# Patient Record
Sex: Male | Born: 1975 | Race: Black or African American | Hispanic: No | Marital: Married
Health system: Southern US, Community
[De-identification: ages and names within clinical notes are randomized; demographics above are authoritative.]

---

## 2021-01-14 ENCOUNTER — Emergency Department (HOSPITAL_COMMUNITY)
Admission: EM | Admit: 2021-01-14 | Discharge: 2021-01-14 | Disposition: A | Discharge status: Home / Self Care | Payer: No Typology Code available for payment source | Attending: Emergency Medicine | Admitting: Emergency Medicine

## 2021-01-14 ENCOUNTER — Emergency Department (HOSPITAL_COMMUNITY): Payer: No Typology Code available for payment source

## 2021-01-14 ENCOUNTER — Encounter (HOSPITAL_COMMUNITY): Payer: Self-pay | Admitting: Emergency Medicine

## 2021-01-14 ENCOUNTER — Other Ambulatory Visit: Payer: Self-pay

## 2021-01-14 DIAGNOSIS — S60512A Abrasion of left hand, initial encounter: Secondary | ICD-10-CM | POA: Insufficient documentation

## 2021-01-14 DIAGNOSIS — S0990XA Unspecified injury of head, initial encounter: Secondary | ICD-10-CM | POA: Diagnosis present

## 2021-01-14 DIAGNOSIS — S60812A Abrasion of left wrist, initial encounter: Secondary | ICD-10-CM | POA: Diagnosis not present

## 2021-01-14 DIAGNOSIS — S61412A Laceration without foreign body of left hand, initial encounter: Secondary | ICD-10-CM | POA: Insufficient documentation

## 2021-01-14 DIAGNOSIS — Z23 Encounter for immunization: Secondary | ICD-10-CM | POA: Insufficient documentation

## 2021-01-14 DIAGNOSIS — S0101XA Laceration without foreign body of scalp, initial encounter: Secondary | ICD-10-CM | POA: Diagnosis not present

## 2021-01-14 DIAGNOSIS — Y92410 Unspecified street and highway as the place of occurrence of the external cause: Secondary | ICD-10-CM | POA: Diagnosis not present

## 2021-01-14 DIAGNOSIS — T07XXXA Unspecified multiple injuries, initial encounter: Secondary | ICD-10-CM

## 2021-01-14 LAB — I-STAT CHEM 8, ED
BUN: 11 mg/dL (ref 6–20)
Calcium, Ion: 1.15 mmol/L (ref 1.15–1.40)
Chloride: 106 mmol/L (ref 98–111)
Creatinine, Ser: 1.2 mg/dL (ref 0.61–1.24)
Glucose, Bld: 84 mg/dL (ref 70–99)
HCT: 46 % (ref 39.0–52.0)
Hemoglobin: 15.6 g/dL (ref 13.0–17.0)
Potassium: 3.9 mmol/L (ref 3.5–5.1)
Sodium: 140 mmol/L (ref 135–145)
TCO2: 22 mmol/L (ref 22–32)

## 2021-01-14 LAB — ETHANOL: Alcohol, Ethyl (B): 148 mg/dL — ABNORMAL HIGH (ref <10)

## 2021-01-14 LAB — CBC
HCT: 46.1 % (ref 39.0–52.0)
Hemoglobin: 15.1 g/dL (ref 13.0–17.0)
MCH: 32.5 pg (ref 26.0–34.0)
MCHC: 32.8 g/dL (ref 30.0–36.0)
MCV: 99.1 fL (ref 80.0–100.0)
Platelets: 195 10*3/uL (ref 150–400)
RBC: 4.65 MIL/uL (ref 4.22–5.81)
RDW: 13.3 % (ref 11.5–15.5)
WBC: 9.8 10*3/uL (ref 4.0–10.5)
nRBC: 0 % (ref 0.0–0.2)

## 2021-01-14 LAB — URINALYSIS, ROUTINE W REFLEX MICROSCOPIC
Bilirubin Urine: NEGATIVE
Glucose, UA: NEGATIVE mg/dL
Hgb urine dipstick: NEGATIVE
Ketones, ur: NEGATIVE mg/dL
Leukocytes,Ua: NEGATIVE
Nitrite: NEGATIVE
Protein, ur: NEGATIVE mg/dL
Specific Gravity, Urine: 1.009 (ref 1.005–1.030)
pH: 5 (ref 5.0–8.0)

## 2021-01-14 LAB — COMPREHENSIVE METABOLIC PANEL
ALT: 17 U/L (ref 0–44)
AST: 24 U/L (ref 15–41)
Albumin: 4.2 g/dL (ref 3.5–5.0)
Alkaline Phosphatase: 68 U/L (ref 38–126)
Anion gap: 13 (ref 5–15)
BUN: 10 mg/dL (ref 6–20)
CO2: 22 mmol/L (ref 22–32)
Calcium: 9.2 mg/dL (ref 8.9–10.3)
Chloride: 103 mmol/L (ref 98–111)
Creatinine, Ser: 1.08 mg/dL (ref 0.61–1.24)
GFR, Estimated: >60 mL/min (ref >60)
Glucose, Bld: 91 mg/dL (ref 70–99)
Potassium: 4.1 mmol/L (ref 3.5–5.1)
Sodium: 138 mmol/L (ref 135–145)
Total Bilirubin: 0.4 mg/dL (ref 0.3–1.2)
Total Protein: 7.4 g/dL (ref 6.5–8.1)

## 2021-01-14 LAB — PROTIME-INR
INR: 1 (ref 0.8–1.2)
Prothrombin Time: 12.6 seconds (ref 11.4–15.2)

## 2021-01-14 MED ORDER — TETANUS-DIPHTH-ACELL PERTUSSIS 5-2.5-18.5 LF-MCG/0.5 IM SUSY
0.5000 mL | PREFILLED_SYRINGE | Freq: Once | INTRAMUSCULAR | Status: AC
Start: 2021-01-14 — End: 2021-01-14
  Administered 2021-01-14: 0.5 mL via INTRAMUSCULAR
  Filled 2021-01-14: qty 0.5

## 2021-01-14 MED ORDER — CYCLOBENZAPRINE HCL 10 MG PO TABS: 10.0000 mg | ORAL_TABLET | Freq: Three times a day (TID) | ORAL | 0 refills | Status: AC | PRN

## 2021-01-14 MED ORDER — LIDOCAINE-EPINEPHRINE 1 %-1:100000 IJ SOLN
20.0000 mL | Freq: Once | INTRAMUSCULAR | Status: AC
  Administered 2021-01-14: 20 mL
  Filled 2021-01-14: qty 1

## 2021-01-14 MED ORDER — FENTANYL CITRATE (PF) 100 MCG/2ML IJ SOLN
50.0000 ug | Freq: Once | INTRAMUSCULAR | Status: AC
Start: 2021-01-14 — End: 2021-01-14
  Administered 2021-01-14: 50 ug via INTRAVENOUS
  Filled 2021-01-14: qty 2

## 2021-01-14 MED ORDER — IBUPROFEN 800 MG PO TABS: 800.0000 mg | ORAL_TABLET | Freq: Three times a day (TID) | ORAL | 0 refills | Status: AC

## 2021-01-14 NOTE — ED Notes (Signed)
Patient transported to CT 

## 2021-01-14 NOTE — ED Notes (Signed)
Pt returned from CT °
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
